# Patient Record
Sex: Male | Born: 1990 | Hispanic: Yes | Marital: Single | Smoking: Never smoker
Health system: Southern US, Community
[De-identification: ages and names within clinical notes are randomized; demographics above are authoritative.]

---

## 2016-07-04 ENCOUNTER — Emergency Department (HOSPITAL_BASED_OUTPATIENT_CLINIC_OR_DEPARTMENT_OTHER): Payer: Worker's Compensation

## 2016-07-04 ENCOUNTER — Emergency Department (HOSPITAL_BASED_OUTPATIENT_CLINIC_OR_DEPARTMENT_OTHER)
Admission: EM | Admit: 2016-07-04 | Discharge: 2016-07-04 | Disposition: A | Discharge status: Home / Self Care | Payer: Worker's Compensation | Attending: Emergency Medicine | Admitting: Emergency Medicine

## 2016-07-04 ENCOUNTER — Encounter (HOSPITAL_BASED_OUTPATIENT_CLINIC_OR_DEPARTMENT_OTHER): Payer: Self-pay | Admitting: *Deleted

## 2016-07-04 DIAGNOSIS — F0781 Postconcussional syndrome: Secondary | ICD-10-CM

## 2016-07-04 DIAGNOSIS — Y990 Civilian activity done for income or pay: Secondary | ICD-10-CM | POA: Diagnosis not present

## 2016-07-04 DIAGNOSIS — Y929 Unspecified place or not applicable: Secondary | ICD-10-CM | POA: Diagnosis not present

## 2016-07-04 DIAGNOSIS — M542 Cervicalgia: Secondary | ICD-10-CM | POA: Diagnosis not present

## 2016-07-04 DIAGNOSIS — Y939 Activity, unspecified: Secondary | ICD-10-CM | POA: Diagnosis not present

## 2016-07-04 DIAGNOSIS — W1839XA Other fall on same level, initial encounter: Secondary | ICD-10-CM | POA: Diagnosis not present

## 2016-07-04 DIAGNOSIS — R51 Headache: Secondary | ICD-10-CM | POA: Insufficient documentation

## 2016-07-04 MED ORDER — NAPROXEN 500 MG PO TABS: 500.0000 mg | ORAL_TABLET | Freq: Two times a day (BID) | ORAL | Status: DC

## 2016-07-04 MED ORDER — CYCLOBENZAPRINE HCL 10 MG PO TABS: 10.0000 mg | ORAL_TABLET | Freq: Two times a day (BID) | ORAL | Status: DC | PRN

## 2016-07-04 NOTE — ED Notes (Signed)
Pt fell yesterday at work, hitting his head.  Felt okay yesterday.  This morning, awoke with bilateral neck pain and headache described as "pressure" in his head and both eyes.  After eating lunch today, pt states he began to feel dizzy and nauseous and feels like he started having complication in conversing.  Pt also states his hands started shaking after lunch and he feels "very tired" and weak.

## 2016-07-04 NOTE — ED Notes (Addendum)
He fell backward hitting his head at work yesterday. Possible LOC. Today he is having pain in his neck. This is workmans comp and he does need a drug screen.

## 2016-07-04 NOTE — ED Notes (Signed)
Translator phone in room for use.

## 2016-07-04 NOTE — Discharge Instructions (Signed)
Sndrome posconmocional (Post-Concussion Syndrome) El sndrome posconmocional se refiere a los sntomas que pueden ocurrir despus de una lesin en la cabeza. Estos sntomas pueden durar desde varias semanas hasta meses. CUIDADOS EN EL HOGAR   Tome los medicamentos solamente como se lo haya indicado el mdico. No tome aspirina.  Duerma con la cabeza levantada para Corning Incorporatedaliviar los dolores de Turkmenistancabeza.  Evite las actividades que puedan causarle otra lesin cerebral.  No practique deportes de contacto, como ftbol o ftbol americano, hockey o bsquet.  No practique otras actividades riesgosas, como esqu extremo, artes marciales o equitacin, hasta que el mdico se lo permita.  Concurra a todas las visitas de control como se lo haya indicado el mdico. Esto es importante. SOLICITE AYUDA SI:   Tiene dificultades para hacer lo siguiente:  Prestar atencin.  Concentrarse.  Recordar.  Aprender informacin nueva.  Sobrellevar el estrs.  Necesita ms tiempo para terminar las tareas.  Se perturba fcilmente (est irritable).  Tiene ms sntomas. Solicite ayuda si presenta cualquiera de estos sntomas durante ms de Science Applications Internationaldos semanas despus de la lesin:   Dolores de cabeza que perduran (crnicos).  Mareos.  Dificultad para mantener el equilibrio.  Ganas de vomitar (nuseas).  Problemas de visin.  Molestias por los ruidos o las luces.  Depresin.  Cambios en el estado de nimo.  Sensacin de preocupacin (ansiedad).  Poca tolerancia general.  Problemas de memoria.  Dificultad para prestar atencin o concentrarse.  Problemas para dormir.  Cansancio permanente. SOLICITE AYUDA DE INMEDIATO SI:  Se siente confundido.  Se siente muy somnoliento.  Le cuesta despertarse.  Tiene Programme researcher, broadcasting/film/videomalestar estomacal.  No deja de vomitar.  Tiene la sensacin de que se est moviendo mientras est quieto (vrtigo).  Tiene movimientos oculares muy rpidos de un lado al otro.  Empieza a  temblar (convulsiones) o se desvanece (desmayo).  Le duele la cabeza y no mejora con medicamentos.  No puede mover los brazos o las piernas normalmente.  Uno de los centros negros de los ojos (pupilas) est ms grande que el otro.  Presenta una secrecin clara o con sangre que proviene de la nariz o de los odos.  Los sntomas Building services engineerempeoran en lugar de Scientist, clinical (histocompatibility and immunogenetics)mejorar. ASEGRESE DE QUE:  Comprende estas instrucciones.  Controlar su afeccin.  Recibir ayuda de inmediato si no mejora o si empeora.   Esta informacin no tiene Theme park managercomo fin reemplazar el consejo del mdico. Asegrese de hacerle al mdico cualquier pregunta que tenga.  [facility] for [facility] if your symptoms do not improve. Take Naprosyn and Flexeril as needed for pain and inflammation. Get plenty of rest. Flexeril will make you tired, take this at night time. Return to the ED if you experience severe worsening of your symptoms, loss of consciousness, blurry vision, vomiting, loss of control of your bowels or bladder, numbness/tingling in both of your lower extremities.

## 2016-07-04 NOTE — ED Provider Notes (Signed)
CSN: [id number]     Arrival date & time 07/04/16  1836 History  By signing my name below, I, Lance Rios, attest that this documentation has been prepared under the direction and in the presence of Lance Dowless, PA-C.  Electronically Signed: Rosario Rios, ED Scribe. 07/04/2016. 7:34 PM.   Chief Complaint  Patient presents with  . Head Injury  . Neck Pain   The history is provided by the patient. A language interpreter was used [place]).   HPI Comments: Lance Rios is a 25 y.o. male with no pertinent PMHx who presents to the Emergency Department complaining of sudden onset, gradually worsening, constant, diffuse neck pain s/p ground-level mechanical fall that occurred 1 day ago. No LOC. Pt reports that when he fell, he fell backwards and hit his head on concrete and notes that "all his weight when into his head". He states that after the accident he was only experiencing only a mild headache. Today, however, he woke up with a worsened headache, and new neck pain. Pt has also been experiencing mild nausea, difficulty recalling events, dizziness, drowsiness, and he states that his hands have been also been shaky. Pt also notes that sometimes he will experience intermittent bright white lights when looking around. He has been ambulatory after the accident, but states that he will get dizzy upon ambulation. He has taken Acetaminophen for his headache and pain with no relief. His pain is worsened with movement of his neck to the left and right. Pt is not on anticoagulants. Denies blurry vision.   History reviewed. No pertinent past medical history. History reviewed. No pertinent past surgical history. No family history on file. Social History  Substance Use Topics  . Smoking status: Never Smoker   . Smokeless tobacco: None  . Alcohol Use: No    Review of Systems A complete 10 system review of systems was obtained and all systems are negative except as noted in the  HPI and PMH.   Allergies  Review of patient's allergies indicates no known allergies.  Home Medications   Prior to Admission medications   Not on File   BP 123/82 mmHg  Pulse 82  Temp(Src) 98.6 F (37 C) (Oral)  Resp 20  Ht 5\' 7"  (1.702 m)  Wt 170 lb (77.111 kg)  BMI 26.62 kg/m2  SpO2 100%   Physical Exam  Constitutional: He is oriented to person, place, and time. He appears well-developed and well-nourished. No distress.  HENT:  Head: Normocephalic and atraumatic.  Eyes: Conjunctivae are normal. Right eye exhibits no discharge. Left eye exhibits no discharge. No scleral icterus.  Neck: Normal range of motion. Neck supple.  Mild TTP over midline cervical spine. No step-offs or obvious bony deformities. No decreased range of motion of C-spine. Mild TTP over bilateral SCM muscles.  Cardiovascular: Normal rate.   Pulmonary/Chest: Effort normal.  Musculoskeletal:  No midline spinal tenderness. Full range of motion of C, T, L-spine.  Neurological: He is alert and oriented to person, place, and time. Coordination normal.  Strength 5/5 throughout. No sensory deficits.  No gait abnormality.  Skin: Skin is warm and dry. No rash noted. He is not diaphoretic. No erythema. No pallor.  Psychiatric: He has a normal mood and affect. His behavior is normal.  Nursing note and vitals reviewed.  ED Course  Procedures   DIAGNOSTIC STUDIES: Oxygen Saturation is 100% on RA, normal by my interpretation.   COORDINATION OF CARE: 7:34 PM-Discussed next steps with pt. Pt  verbalized understanding and is agreeable with the plan.   Imaging Review Ct Head Wo Contrast  07/04/2016  CLINICAL DATA:  Pain following fall 1 day prior. Currently with dizziness and nausea EXAM: CT HEAD WITHOUT CONTRAST CT CERVICAL SPINE WITHOUT CONTRAST TECHNIQUE: Multidetector CT imaging of the head and cervical spine was performed following the standard protocol without intravenous contrast. Multiplanar CT image  reconstructions of the cervical spine were also generated. COMPARISON:  None. FINDINGS: CT HEAD FINDINGS The ventricles are normal in size and configuration. There is no intracranial mass, hemorrhage, extra-axial fluid collection, or midline shift. The gray-white compartments are normal. Occasional foci of basal ganglia calcification are felt to be physiologic. No acute infarct evident. The bony calvarium appears intact. The mastoid air cells are clear. There is no hyperdense vessel or vascular calcification evident. There is a retention cyst in the inferior left maxillary antrum. Other paranasal sinuses are clear. There is mild deviation of the nasal septum toward the right. No intraorbital lesions are evident. CT CERVICAL SPINE FINDINGS There is no fracture or spondylolisthesis. Prevertebral soft tissues and predental space regions are normal. The disc spaces are normal. There is no nerve root edema or effacement. No disc extrusion or stenosis. IMPRESSION: CT head: Retention cyst inferior left maxillary antrum. Deviated nasal septum. No intracranial mass, hemorrhage, or extra-axial fluid collection. Gray-white compartments are normal. CT cervical spine: No fracture or spondylolisthesis. No apparent arthropathy. Electronically Signed   By: Bretta BangWilliam  Woodruff III M.D.   On: 07/04/2016 20:15   Ct Cervical Spine Wo Contrast  07/04/2016  CLINICAL DATA:  Pain following fall 1 day prior. Currently with dizziness and nausea EXAM: CT HEAD WITHOUT CONTRAST CT CERVICAL SPINE WITHOUT CONTRAST TECHNIQUE: Multidetector CT imaging of the head and cervical spine was performed following the standard protocol without intravenous contrast. Multiplanar CT image reconstructions of the cervical spine were also generated. COMPARISON:  None. FINDINGS: CT HEAD FINDINGS The ventricles are normal in size and configuration. There is no intracranial mass, hemorrhage, extra-axial fluid collection, or midline shift. The gray-white  compartments are normal. Occasional foci of basal ganglia calcification are felt to be physiologic. No acute infarct evident. The bony calvarium appears intact. The mastoid air cells are clear. There is no hyperdense vessel or vascular calcification evident. There is a retention cyst in the inferior left maxillary antrum. Other paranasal sinuses are clear. There is mild deviation of the nasal septum toward the right. No intraorbital lesions are evident. CT CERVICAL SPINE FINDINGS There is no fracture or spondylolisthesis. Prevertebral soft tissues and predental space regions are normal. The disc spaces are normal. There is no nerve root edema or effacement. No disc extrusion or stenosis. IMPRESSION: CT head: Retention cyst inferior left maxillary antrum. Deviated nasal septum. No intracranial mass, hemorrhage, or extra-axial fluid collection. Gray-white compartments are normal. CT cervical spine: No fracture or spondylolisthesis. No apparent arthropathy. Electronically Signed   By: Bretta BangWilliam  Woodruff III M.D.   On: 07/04/2016 20:15   I have personally reviewed and evaluated these images as part of my medical decision-making.  MDM   Final diagnoses:  Post concussive syndrome   Otherwise healthy 25 year old male presents to the ED with complaints of neck pain, headache, dizziness and nausea after striking his head on concrete yesterday after a mechanical fall. Patient appears well in the ED. No neurological deficits noted on exam. He is not anticoagulated. He is unsure if he lost consciousness yesterday. CT head and C-spine are unremarkable for acute injury. Patient likely  has postconcussive syndrome. Patient was able to ambulate in the ED without difficulty. Recommend follow-up with his primary care doctor. Patient may take NSAIDs for pain. Return precautions outlined in patient discharge instructions. I personally performed the services described in this documentation, which was scribed in my presence. The  recorded information has been reviewed and is accurate.    Lester Kinsman Fox, PA-C 07/05/16 0020  Doug Sou, MD 07/05/16 [phone]

## 2020-06-27 ENCOUNTER — Emergency Department
Admission: EM | Admit: 2020-06-27 | Discharge: 2020-06-27 | Disposition: A | Discharge status: Home / Self Care | Payer: Self-pay | Source: Home / Self Care

## 2020-06-27 ENCOUNTER — Other Ambulatory Visit: Payer: Self-pay

## 2020-06-27 ENCOUNTER — Encounter: Payer: Self-pay | Admitting: Emergency Medicine

## 2020-06-27 ENCOUNTER — Ambulatory Visit: Payer: Self-pay

## 2020-06-27 DIAGNOSIS — H6121 Impacted cerumen, right ear: Secondary | ICD-10-CM

## 2020-06-27 DIAGNOSIS — H6691 Otitis media, unspecified, right ear: Secondary | ICD-10-CM

## 2020-06-27 MED ORDER — AMOXICILLIN-POT CLAVULANATE 875-125 MG PO TABS: 1.0000 | ORAL_TABLET | Freq: Two times a day (BID) | ORAL | 0 refills | Status: DC

## 2020-06-27 NOTE — ED Triage Notes (Signed)
Patient states that he went swimming yesterday and now having right ear pain.  Patient has not taking anything for pain, afebrile.

## 2020-06-27 NOTE — Discharge Instructions (Signed)

## 2021-04-11 DIAGNOSIS — J029 Acute pharyngitis, unspecified: Secondary | ICD-10-CM | POA: Diagnosis not present

## 2021-04-16 DIAGNOSIS — Z3189 Encounter for other procreative management: Secondary | ICD-10-CM | POA: Diagnosis not present

## 2021-05-12 DIAGNOSIS — Z3189 Encounter for other procreative management: Secondary | ICD-10-CM | POA: Diagnosis not present

## 2021-07-22 ENCOUNTER — Ambulatory Visit: Payer: BC Managed Care – PPO | Admitting: Podiatry

## 2021-07-22 ENCOUNTER — Other Ambulatory Visit: Payer: Self-pay

## 2021-07-22 ENCOUNTER — Encounter: Payer: Self-pay | Admitting: Podiatry

## 2021-07-22 DIAGNOSIS — B351 Tinea unguium: Secondary | ICD-10-CM

## 2021-07-22 DIAGNOSIS — Z79899 Other long term (current) drug therapy: Secondary | ICD-10-CM | POA: Diagnosis not present

## 2021-07-22 NOTE — Progress Notes (Signed)
  Subjective:  Patient ID: Lance Rios, male    DOB: 1991-09-30,  MRN: [id number]  Chief Complaint  Patient presents with   Nail Problem    Nail fungus     30 y.o. male presents with the above complaint.  Patient presents with complaint of bilateral hallux nail fungus with thickened elongated dystrophic nails x2.  Patient is being a 2 to 3 years has progressive gotten worse.  He is tried many over-the-counter medication nothing has helped.  He is here to discuss further treatment options.  He has not seen anyone else prior to seeing me.  He has mild pain but not much pain at all.  He denies any other acute complaints   Review of Systems: Negative except as noted in the HPI. Denies N/V/F/Ch.  No past medical history on file.  Current Outpatient Medications:    amoxicillin-clavulanate (AUGMENTIN) 875-125 MG tablet, Take 1 tablet by mouth 2 (two) times daily. One po bid x 7 days, Disp: 14 tablet, Rfl: 0   cyclobenzaprine (FLEXERIL) 10 MG tablet, Take 1 tablet (10 mg total) by mouth 2 (two) times daily as needed for muscle spasms., Disp: 20 tablet, Rfl: 0   naproxen (NAPROSYN) 500 MG tablet, Take 1 tablet (500 mg total) by mouth 2 (two) times daily., Disp: 30 tablet, Rfl: 0  Social History   Tobacco Use  Smoking Status Never  Smokeless Tobacco Never    No Known Allergies Objective:  There were no vitals filed for this visit. There is no height or weight on file to calculate BMI. Constitutional Well developed. Well nourished.  Vascular Dorsalis pedis pulses palpable bilaterally. Posterior tibial pulses palpable bilaterally. Capillary refill normal to all digits.  No cyanosis or clubbing noted. Pedal hair growth normal.  Neurologic Normal speech. Oriented to person, place, and time. Epicritic sensation to light touch grossly present bilaterally.  Dermatologic Nails thickened elongated dystrophic mycotic toenails x2 to bilateral hallux.  Mild pain on palpation. Skin within  normal limits  Orthopedic: Normal joint ROM without pain or crepitus bilaterally. No visible deformities. No bony tenderness.   Radiographs: None Assessment:   1. Long-term use of high-risk medication   2. Nail fungus   3. Onychomycosis due to dermatophyte    Plan:  Patient was evaluated and treated and all questions answered.  Bilateral hallux onychomycosis -Educated the patient on the etiology of onychomycosis and various treatment options associated with improving the fungal load.  I explained to the patient that there is 3 treatment options available to treat the onychomycosis including topical, p.o., laser treatment.  Patient elected to undergo p.o. options with Lamisil/terbinafine therapy.  In order for me to start the medication therapy, I explained to the patient the importance of evaluating the liver and obtaining the liver function test.  Once the liver function test comes back normal I will start him on 47-month course of Lamisil therapy.  Patient understood all risk and would like to proceed with Lamisil therapy.  I have asked the patient to immediately stop the Lamisil therapy if she has any reactions to it and call the office or go to the emergency room right away.  Patient states understanding   No follow-ups on file.

## 2021-07-22 NOTE — Progress Notes (Signed)
e

## 2021-07-23 LAB — HEPATIC FUNCTION PANEL
AG Ratio: 1.7 (calc) (ref 1.0–2.5)
ALT: 15 U/L (ref 9–46)
AST: 15 U/L (ref 10–40)
Albumin: 4.6 g/dL (ref 3.6–5.1)
Alkaline phosphatase (APISO): 38 U/L (ref 36–130)
Bilirubin, Direct: 0.1 mg/dL (ref 0.0–0.2)
Globulin: 2.7 g/dL (calc) (ref 1.9–3.7)
Indirect Bilirubin: 0.5 mg/dL (calc) (ref 0.2–1.2)
Total Bilirubin: 0.6 mg/dL (ref 0.2–1.2)
Total Protein: 7.3 g/dL (ref 6.1–8.1)

## 2021-07-25 MED ORDER — TERBINAFINE HCL 250 MG PO TABS: 250.0000 mg | ORAL_TABLET | Freq: Every day | ORAL | 0 refills | Status: DC

## 2021-07-25 NOTE — Addendum Note (Signed)
Addended by: Mignon Bechler on: 07/25/2021 11:02 AM   Modules accepted: Orders  

## 2021-10-04 DIAGNOSIS — W268XXA Contact with other sharp object(s), not elsewhere classified, initial encounter: Secondary | ICD-10-CM | POA: Diagnosis not present

## 2021-10-04 DIAGNOSIS — Y990 Civilian activity done for income or pay: Secondary | ICD-10-CM | POA: Diagnosis not present

## 2021-10-04 DIAGNOSIS — S61412A Laceration without foreign body of left hand, initial encounter: Secondary | ICD-10-CM | POA: Diagnosis not present

## 2021-10-11 DIAGNOSIS — M9904 Segmental and somatic dysfunction of sacral region: Secondary | ICD-10-CM | POA: Diagnosis not present

## 2021-10-11 DIAGNOSIS — M5442 Lumbago with sciatica, left side: Secondary | ICD-10-CM | POA: Diagnosis not present

## 2021-10-11 DIAGNOSIS — M4728 Other spondylosis with radiculopathy, sacral and sacrococcygeal region: Secondary | ICD-10-CM | POA: Diagnosis not present

## 2021-10-11 DIAGNOSIS — M9903 Segmental and somatic dysfunction of lumbar region: Secondary | ICD-10-CM | POA: Diagnosis not present

## 2021-12-21 DIAGNOSIS — M545 Low back pain, unspecified: Secondary | ICD-10-CM | POA: Diagnosis not present

## 2021-12-23 DIAGNOSIS — M545 Low back pain, unspecified: Secondary | ICD-10-CM | POA: Diagnosis not present

## 2021-12-23 DIAGNOSIS — M5416 Radiculopathy, lumbar region: Secondary | ICD-10-CM | POA: Diagnosis not present

## 2021-12-27 DIAGNOSIS — M545 Low back pain, unspecified: Secondary | ICD-10-CM | POA: Diagnosis not present

## 2021-12-29 DIAGNOSIS — M545 Low back pain, unspecified: Secondary | ICD-10-CM | POA: Diagnosis not present

## 2022-01-03 DIAGNOSIS — M545 Low back pain, unspecified: Secondary | ICD-10-CM | POA: Diagnosis not present

## 2022-01-05 DIAGNOSIS — M545 Low back pain, unspecified: Secondary | ICD-10-CM | POA: Diagnosis not present

## 2022-01-10 DIAGNOSIS — M545 Low back pain, unspecified: Secondary | ICD-10-CM | POA: Diagnosis not present

## 2022-01-12 DIAGNOSIS — M545 Low back pain, unspecified: Secondary | ICD-10-CM | POA: Diagnosis not present

## 2022-01-17 DIAGNOSIS — M545 Low back pain, unspecified: Secondary | ICD-10-CM | POA: Diagnosis not present

## 2022-01-24 DIAGNOSIS — M545 Low back pain, unspecified: Secondary | ICD-10-CM | POA: Diagnosis not present

## 2022-01-24 DIAGNOSIS — M5416 Radiculopathy, lumbar region: Secondary | ICD-10-CM | POA: Diagnosis not present

## 2022-01-26 ENCOUNTER — Other Ambulatory Visit: Payer: Self-pay | Admitting: Orthopaedic Surgery

## 2022-01-26 DIAGNOSIS — M5416 Radiculopathy, lumbar region: Secondary | ICD-10-CM

## 2022-02-08 ENCOUNTER — Encounter: Payer: Self-pay | Admitting: Podiatry

## 2022-02-23 DIAGNOSIS — M792 Neuralgia and neuritis, unspecified: Secondary | ICD-10-CM | POA: Diagnosis not present

## 2022-02-23 DIAGNOSIS — L600 Ingrowing nail: Secondary | ICD-10-CM | POA: Diagnosis not present

## 2022-02-26 ENCOUNTER — Ambulatory Visit
Admission: RE | Admit: 2022-02-26 | Discharge: 2022-02-26 | Disposition: A | Discharge status: Home / Self Care | Payer: Self-pay | Source: Ambulatory Visit | Attending: Orthopaedic Surgery | Admitting: Orthopaedic Surgery

## 2022-02-26 DIAGNOSIS — M5416 Radiculopathy, lumbar region: Secondary | ICD-10-CM

## 2022-02-26 DIAGNOSIS — M48061 Spinal stenosis, lumbar region without neurogenic claudication: Secondary | ICD-10-CM | POA: Diagnosis not present

## 2022-02-26 DIAGNOSIS — M5127 Other intervertebral disc displacement, lumbosacral region: Secondary | ICD-10-CM | POA: Diagnosis not present

## 2022-03-14 DIAGNOSIS — M5416 Radiculopathy, lumbar region: Secondary | ICD-10-CM | POA: Diagnosis not present

## 2022-03-14 DIAGNOSIS — M545 Low back pain, unspecified: Secondary | ICD-10-CM | POA: Diagnosis not present

## 2022-03-23 DIAGNOSIS — M5416 Radiculopathy, lumbar region: Secondary | ICD-10-CM | POA: Diagnosis not present

## 2022-04-05 DIAGNOSIS — M5416 Radiculopathy, lumbar region: Secondary | ICD-10-CM | POA: Diagnosis not present

## 2022-04-19 DIAGNOSIS — Y990 Civilian activity done for income or pay: Secondary | ICD-10-CM | POA: Diagnosis not present

## 2022-04-19 DIAGNOSIS — S0101XA Laceration without foreign body of scalp, initial encounter: Secondary | ICD-10-CM | POA: Diagnosis not present

## 2022-04-19 DIAGNOSIS — S199XXA Unspecified injury of neck, initial encounter: Secondary | ICD-10-CM | POA: Diagnosis not present

## 2022-04-19 DIAGNOSIS — S0990XA Unspecified injury of head, initial encounter: Secondary | ICD-10-CM | POA: Diagnosis not present

## 2022-04-19 DIAGNOSIS — W208XXA Other cause of strike by thrown, projected or falling object, initial encounter: Secondary | ICD-10-CM | POA: Diagnosis not present

## 2022-04-19 DIAGNOSIS — R030 Elevated blood-pressure reading, without diagnosis of hypertension: Secondary | ICD-10-CM | POA: Diagnosis not present

## 2022-04-26 DIAGNOSIS — M5136 Other intervertebral disc degeneration, lumbar region: Secondary | ICD-10-CM | POA: Diagnosis not present

## 2022-04-26 DIAGNOSIS — M5416 Radiculopathy, lumbar region: Secondary | ICD-10-CM | POA: Diagnosis not present

## 2022-04-28 DIAGNOSIS — Z4802 Encounter for removal of sutures: Secondary | ICD-10-CM | POA: Diagnosis not present

## 2022-04-28 DIAGNOSIS — S0101XD Laceration without foreign body of scalp, subsequent encounter: Secondary | ICD-10-CM | POA: Diagnosis not present

## 2022-05-02 DIAGNOSIS — M545 Low back pain, unspecified: Secondary | ICD-10-CM | POA: Diagnosis not present

## 2022-05-04 DIAGNOSIS — M545 Low back pain, unspecified: Secondary | ICD-10-CM | POA: Diagnosis not present

## 2022-05-09 DIAGNOSIS — M545 Low back pain, unspecified: Secondary | ICD-10-CM | POA: Diagnosis not present

## 2022-05-11 DIAGNOSIS — M545 Low back pain, unspecified: Secondary | ICD-10-CM | POA: Diagnosis not present

## 2022-05-16 DIAGNOSIS — M545 Low back pain, unspecified: Secondary | ICD-10-CM | POA: Diagnosis not present

## 2022-05-17 DIAGNOSIS — M5416 Radiculopathy, lumbar region: Secondary | ICD-10-CM | POA: Diagnosis not present

## 2022-05-18 DIAGNOSIS — M545 Low back pain, unspecified: Secondary | ICD-10-CM | POA: Diagnosis not present

## 2022-05-23 DIAGNOSIS — M545 Low back pain, unspecified: Secondary | ICD-10-CM | POA: Diagnosis not present

## 2022-05-25 DIAGNOSIS — M545 Low back pain, unspecified: Secondary | ICD-10-CM | POA: Diagnosis not present

## 2022-05-30 DIAGNOSIS — M545 Low back pain, unspecified: Secondary | ICD-10-CM | POA: Diagnosis not present

## 2022-06-01 DIAGNOSIS — M545 Low back pain, unspecified: Secondary | ICD-10-CM | POA: Diagnosis not present

## 2022-06-06 DIAGNOSIS — M545 Low back pain, unspecified: Secondary | ICD-10-CM | POA: Diagnosis not present

## 2022-06-07 DIAGNOSIS — M5416 Radiculopathy, lumbar region: Secondary | ICD-10-CM | POA: Diagnosis not present

## 2022-06-27 DIAGNOSIS — M545 Low back pain, unspecified: Secondary | ICD-10-CM | POA: Diagnosis not present

## 2022-06-29 DIAGNOSIS — M545 Low back pain, unspecified: Secondary | ICD-10-CM | POA: Diagnosis not present

## 2022-07-04 DIAGNOSIS — M545 Low back pain, unspecified: Secondary | ICD-10-CM | POA: Diagnosis not present

## 2022-07-06 DIAGNOSIS — M545 Low back pain, unspecified: Secondary | ICD-10-CM | POA: Diagnosis not present

## 2022-07-11 DIAGNOSIS — M545 Low back pain, unspecified: Secondary | ICD-10-CM | POA: Diagnosis not present

## 2022-07-13 DIAGNOSIS — M545 Low back pain, unspecified: Secondary | ICD-10-CM | POA: Diagnosis not present

## 2022-07-18 DIAGNOSIS — M545 Low back pain, unspecified: Secondary | ICD-10-CM | POA: Diagnosis not present

## 2022-07-20 DIAGNOSIS — M545 Low back pain, unspecified: Secondary | ICD-10-CM | POA: Diagnosis not present

## 2022-07-25 DIAGNOSIS — M545 Low back pain, unspecified: Secondary | ICD-10-CM | POA: Diagnosis not present

## 2022-08-01 DIAGNOSIS — M545 Low back pain, unspecified: Secondary | ICD-10-CM | POA: Diagnosis not present

## 2022-08-07 DIAGNOSIS — M5416 Radiculopathy, lumbar region: Secondary | ICD-10-CM | POA: Diagnosis not present

## 2022-08-08 DIAGNOSIS — M545 Low back pain, unspecified: Secondary | ICD-10-CM | POA: Diagnosis not present

## 2022-08-10 DIAGNOSIS — M545 Low back pain, unspecified: Secondary | ICD-10-CM | POA: Diagnosis not present

## 2022-10-02 ENCOUNTER — Encounter: Payer: Self-pay | Admitting: Family Medicine

## 2022-10-02 ENCOUNTER — Ambulatory Visit: Payer: BC Managed Care – PPO | Admitting: Family Medicine

## 2022-10-02 VITALS — BP 119/75 | HR 69 | Ht 67.0 in | Wt 189.0 lb

## 2022-10-02 DIAGNOSIS — Z13220 Encounter for screening for lipoid disorders: Secondary | ICD-10-CM

## 2022-10-02 DIAGNOSIS — Z0000 Encounter for general adult medical examination without abnormal findings: Secondary | ICD-10-CM | POA: Diagnosis not present

## 2022-10-02 LAB — CBC
Hemoglobin: 14.4 g/dL (ref 13.2–17.1)
MCH: 29.3 pg (ref 27.0–33.0)
MCHC: 34 g/dL (ref 32.0–36.0)
MPV: 10.2 fL (ref 7.5–12.5)
RBC: 4.91 10*6/uL (ref 4.20–5.80)
RDW: 12.2 % (ref 11.0–15.0)

## 2022-10-02 NOTE — Assessment & Plan Note (Signed)
-   pt doing well have ordered screening labs at this time  - CBC, CMP, lipid  - can follow up as needed

## 2022-10-02 NOTE — Progress Notes (Signed)
New Patient Office Visit  Subjective    Patient ID: Lance Rios, male    DOB: 05/16/1991  Age: 31 y.o. MRN: [id number]  CC:  Chief Complaint  Patient presents with   Establish Care    HPI Lance Shirkey presents to establish care.   Diet: decreased carb intake/carb  Exercise: runs a lot  Family hx: HTN: No DM: No Cancer: grandfather-stomach cancer  Social hx: Alcohol use: occasionally  Tobacco (chew, smoke): former smoker for two years  Who do you live with: wife, Gloriajean [facility] House: yes  Safe at home: yes    Has a pmh of: Herniation disc L4/L5  He does physical therapy that helps He did three epidural steroid shots   Outpatient Encounter Medications as of 10/02/2022  Medication Sig   [DISCONTINUED] amoxicillin-clavulanate (AUGMENTIN) 875-125 MG tablet Take 1 tablet by mouth 2 (two) times daily. One po bid x 7 days (Patient not taking: Reported on 10/02/2022)   [DISCONTINUED] cyclobenzaprine (FLEXERIL) 10 MG tablet Take 1 tablet (10 mg total) by mouth 2 (two) times daily as needed for muscle spasms. (Patient not taking: Reported on 10/02/2022)   [DISCONTINUED] naproxen (NAPROSYN) 500 MG tablet Take 1 tablet (500 mg total) by mouth 2 (two) times daily. (Patient not taking: Reported on 10/02/2022)   [DISCONTINUED] terbinafine (LAMISIL) 250 MG tablet Take 1 tablet (250 mg total) by mouth daily. (Patient not taking: Reported on 10/02/2022)   No facility-administered encounter medications on file as of 10/02/2022.    No past medical history on file.  No past surgical history on file.  No family history on file.  Social History   Socioeconomic History   Marital status: Single    Spouse name: Not on file   Number of children: Not on file   Years of education: Not on file   Highest education level: Not on file  Occupational History   Not on file  Tobacco Use   Smoking status: Never   Smokeless tobacco: Never  Substance and Sexual Activity   Alcohol use: No    Drug use: No   Sexual activity: Not on file  Other Topics Concern   Not on file  Social History Narrative   ** Merged History Encounter **       Social Determinants of Health   Financial Resource Strain: Not on file  Food Insecurity: Not on file  Transportation Needs: Not on file  Physical Activity: Not on file  Stress: Not on file  Social Connections: Not on file  Intimate Partner Violence: Not on file    Review of Systems  Constitutional:  Negative for chills and fever.  Respiratory:  Negative for cough and shortness of breath.   Cardiovascular:  Negative for chest pain.  Neurological:  Negative for headaches.        Objective    BP 119/75   Pulse 69   Ht 5\' 7"  (1.702 m)   Wt 189 lb (85.7 kg)   SpO2 100%   BMI 29.60 kg/m   Physical Exam Vitals and nursing note reviewed.  Constitutional:      General: He is not in acute distress.    Appearance: Normal appearance.  HENT:     Head: Normocephalic and atraumatic.     Right Ear: External ear normal.     Left Ear: External ear normal.     Nose: Nose normal.  Eyes:     Conjunctiva/sclera: Conjunctivae normal.  Cardiovascular:     Rate and Rhythm: Normal rate  and regular rhythm.  Pulmonary:     Effort: Pulmonary effort is normal.     Breath sounds: Normal breath sounds.  Abdominal:     General: Abdomen is flat. Bowel sounds are normal.     Palpations: Abdomen is soft.  Neurological:     General: No focal deficit present.     Mental Status: He is alert and oriented to person, place, and time.  Psychiatric:        Mood and Affect: Mood normal.        Behavior: Behavior normal.        Thought Content: Thought content normal.        Judgment: Judgment normal.       Assessment & Plan:   Problem List Items Addressed This Visit       Other   Routine adult health maintenance - Primary    - pt doing well have ordered screening labs at this time  - CBC, CMP, lipid  - can follow up as needed        Relevant Orders   CBC   Lipid panel   COMPLETE METABOLIC PANEL WITH GFR   Other Visit Diagnoses     Screening for lipid disorders       Relevant Orders   Lipid panel       Return if symptoms worsen or fail to improve.   Owens Loffler, DO

## 2022-10-03 LAB — COMPLETE METABOLIC PANEL WITH GFR
AG Ratio: 1.7 (calc) (ref 1.0–2.5)
ALT: 14 U/L (ref 9–46)
AST: 14 U/L (ref 10–40)
Albumin: 4.7 g/dL (ref 3.6–5.1)
Alkaline phosphatase (APISO): 48 U/L (ref 36–130)
BUN: 11 mg/dL (ref 7–25)
CO2: 28 mmol/L (ref 20–32)
Calcium: 9.7 mg/dL (ref 8.6–10.3)
Chloride: 102 mmol/L (ref 98–110)
Creat: 0.87 mg/dL (ref 0.60–1.26)
Globulin: 2.8 g/dL (calc) (ref 1.9–3.7)
Glucose, Bld: 91 mg/dL (ref 65–99)
Potassium: 4.2 mmol/L (ref 3.5–5.3)
Sodium: 138 mmol/L (ref 135–146)
Total Bilirubin: 0.5 mg/dL (ref 0.2–1.2)
Total Protein: 7.5 g/dL (ref 6.1–8.1)
eGFR: 118 mL/min/{1.73_m2} (ref >=60)

## 2022-10-03 LAB — LIPID PANEL
Cholesterol: 208 mg/dL — ABNORMAL HIGH (ref <200)
HDL: 50 mg/dL (ref >=40)
LDL Cholesterol (Calc): 141 mg/dL (calc) — ABNORMAL HIGH
Non-HDL Cholesterol (Calc): 158 mg/dL (calc) — ABNORMAL HIGH (ref <130)
Total CHOL/HDL Ratio: 4.2 (calc) (ref <5.0)
Triglycerides: 74 mg/dL (ref <150)

## 2022-10-03 LAB — CBC
HCT: 42.3 % (ref 38.5–50.0)
MCV: 86.2 fL (ref 80.0–100.0)
Platelets: 298 10*3/uL (ref 140–400)
WBC: 5.7 10*3/uL (ref 3.8–10.8)

## 2023-04-23 IMAGING — MR MR LUMBAR SPINE W/O CM
4 of 5 series · 18 of 48 positions shown · non-contrast
Comparison: None.

CLINICAL DATA: Initial evaluation for low back pain with left lower
extremity pain.

EXAM:
MRI LUMBAR SPINE WITHOUT CONTRAST
TECHNIQUE: Multiplanar, multisequence MR imaging of the lumbar spine was
performed. No intravenous contrast was administered.

[Series 5: T2 · sagittal · 4.0mm · 0.73mm/px · 6 of 13 slices shown (1 of 2)]
[im 1/13]
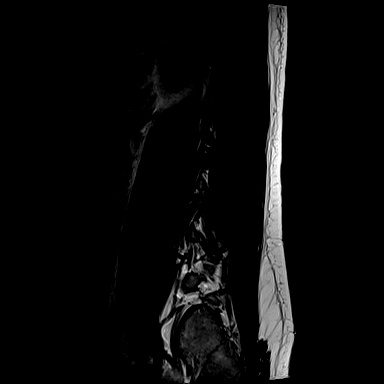
[im 3/13]
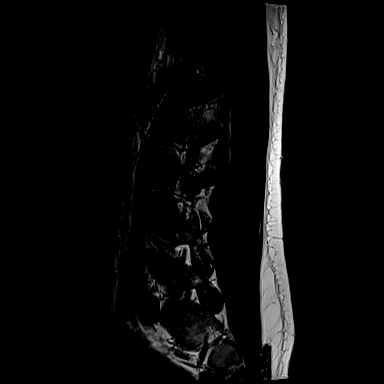
[im 5/13]
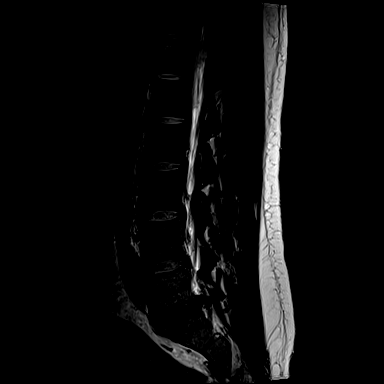
[im 8/13]
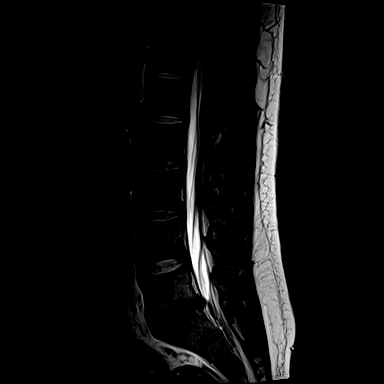
[im 10/13]
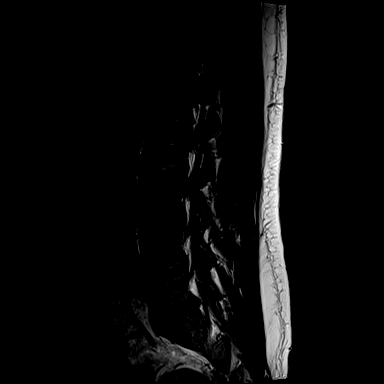
[im 13/13]
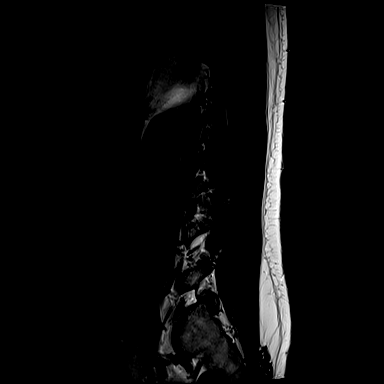

[Series 6: T1 · sagittal · 4.0mm · 0.73mm/px · 3 of 13 slices shown (1 of 2)]
[im 1/13]
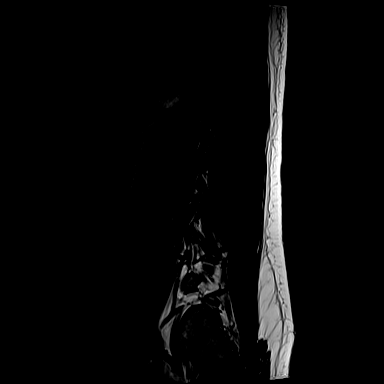
[im 7/13]
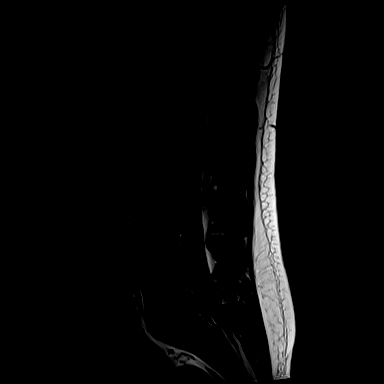
[im 13/13]
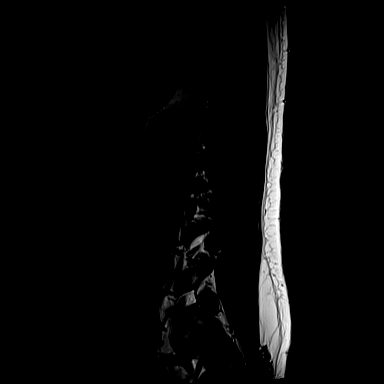

[Series 10: T2 · axial · 4.0mm · 0.28mm/px · z∈[-87,+86]mm · 6 of 38 slices shown (2 of 2)]
[im 3/38]
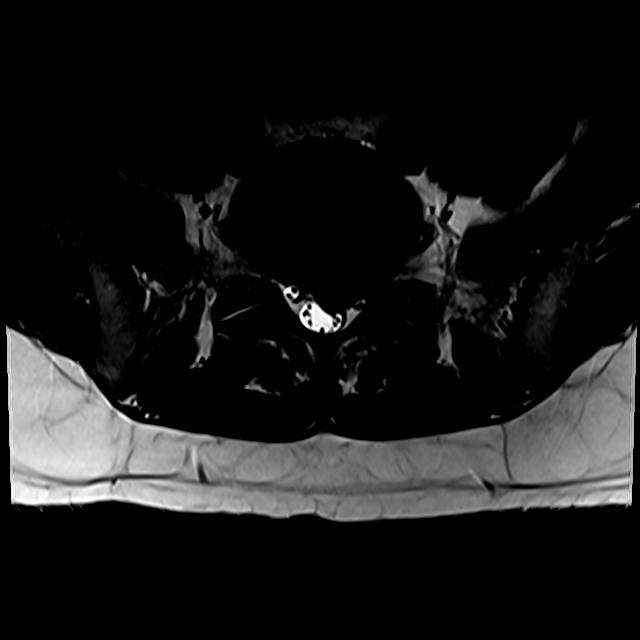
[im 5/38]
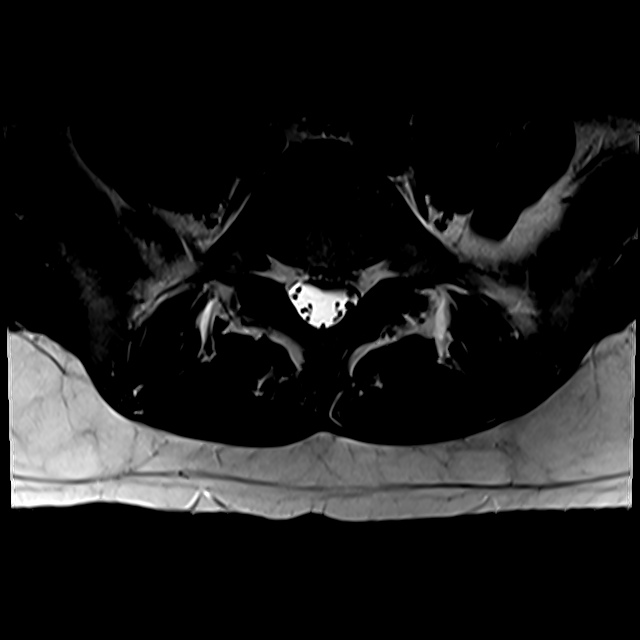
[im 8/38]
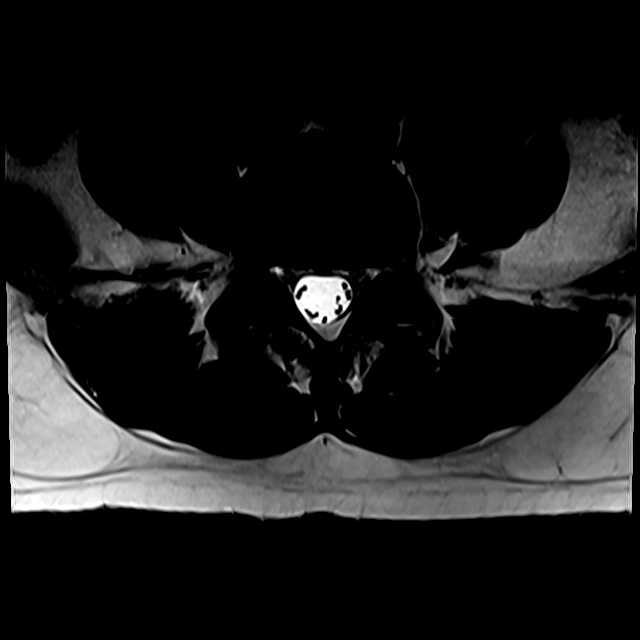
[im 13/38]
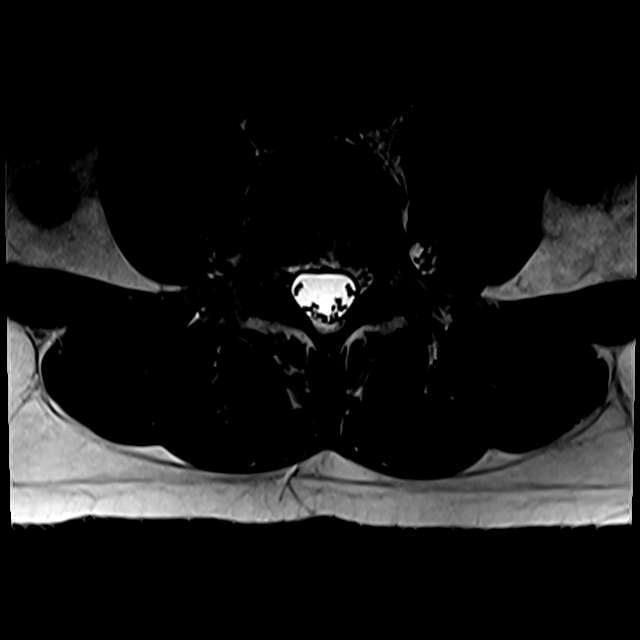
[im 20/38]
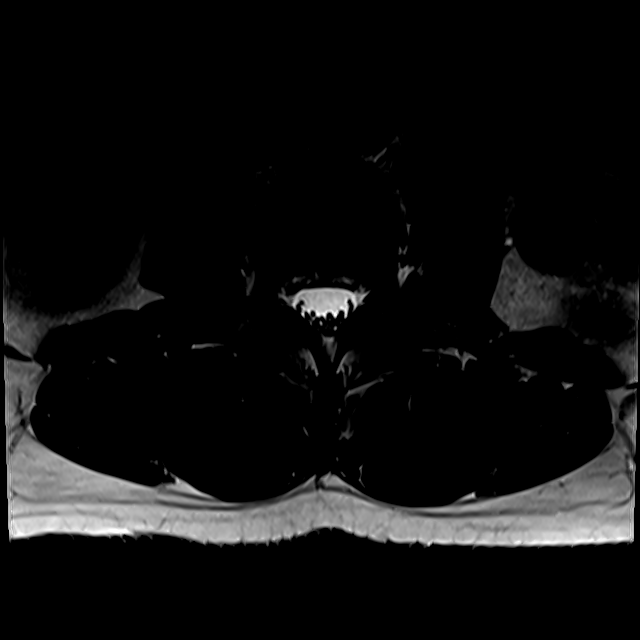
[im 33/38]
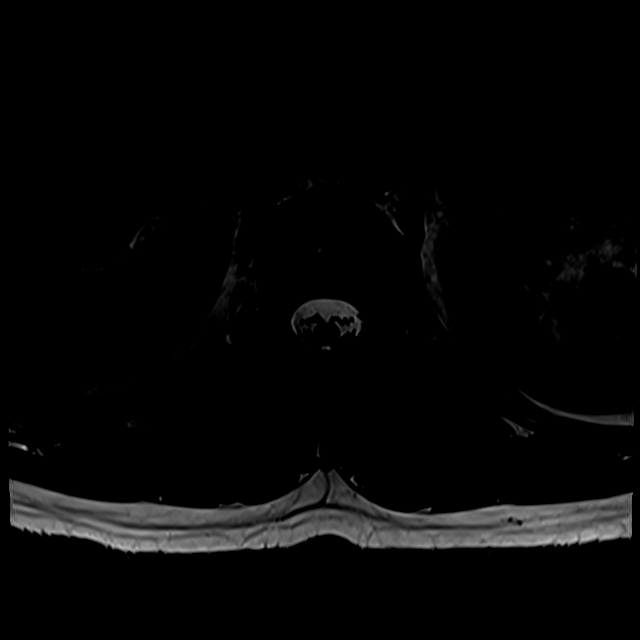

[Series 13: T1 · axial · 4.0mm · 0.28mm/px · z∈[-77,+86]mm · 3 of 38 slices shown (2 of 2)]
[im 5/38]
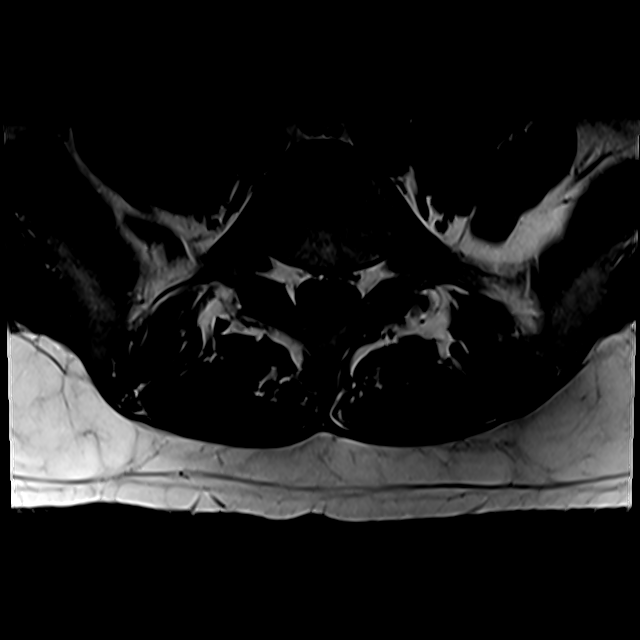
[im 20/38]
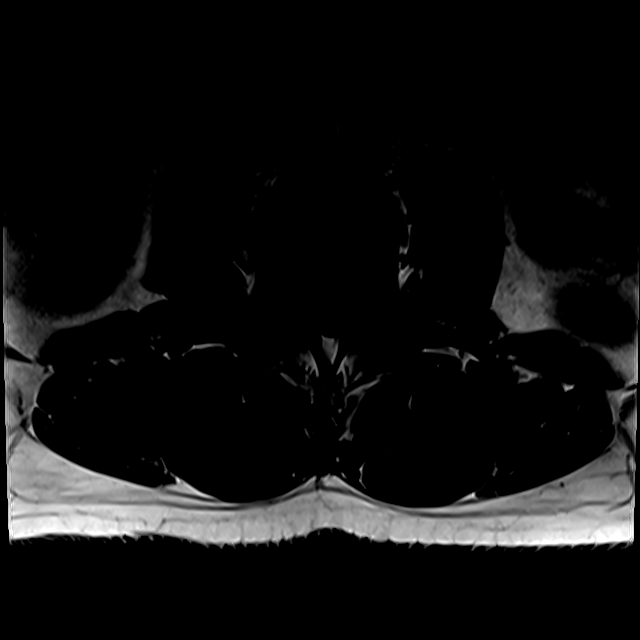
[im 33/38]
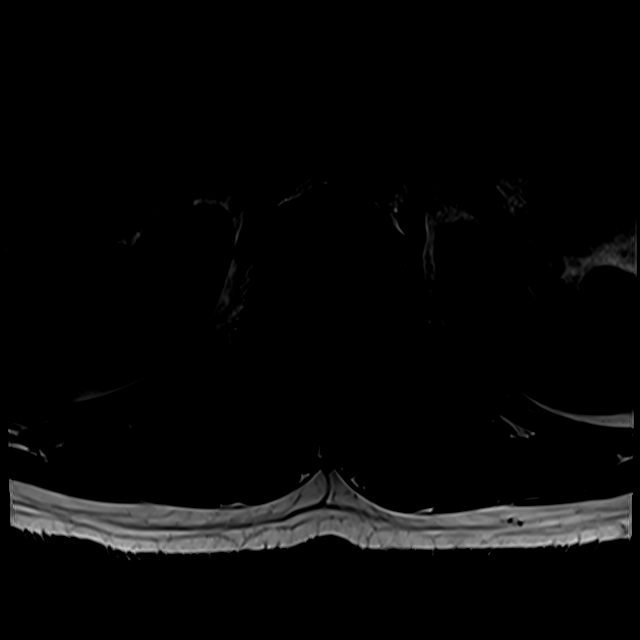

[18 of 48 positions shown; findings below may reference images not displayed]

FINDINGS: Segmentation: Standard. Lowest well-formed disc space labeled the
L5-S1 level.

Alignment: Physiologic with preservation of the normal lumbar
lordosis. No listhesis.

Vertebrae: Vertebral body height maintained without acute or chronic
fracture. Bone marrow signal intensity within normal limits. Few
scattered subcentimeter benign hemangiomata noted. No worrisome
osseous lesions or abnormal marrow edema.

Conus medullaris and cauda equina: Conus extends to the L1 level.
Conus and cauda equina appear normal.

Paraspinal and other soft tissues: Unremarkable.

Disc levels:

No significant findings are seen through the L4-5 level.

L5-S1: Left subarticular disc protrusion extends into the left
lateral recess, impinging upon and displacing the descending left S1
nerve root (series 10, image 35). Moderate left lateral recess
stenosis. Central canal remains patent. No foraminal stenosis.
IMPRESSION: 1. Left subarticular disc protrusion at L5-S1, impinging upon the
descending left S1 nerve root.
2. Otherwise normal MRI of the lumbar spine.

## 2023-08-09 DIAGNOSIS — H6991 Unspecified Eustachian tube disorder, right ear: Secondary | ICD-10-CM | POA: Diagnosis not present

## 2023-08-09 DIAGNOSIS — R42 Dizziness and giddiness: Secondary | ICD-10-CM | POA: Diagnosis not present

## 2024-04-24 ENCOUNTER — Encounter: Payer: Self-pay | Admitting: Family Medicine
